# Patient Record
Sex: Male | Born: 1960 | Hispanic: Yes | Marital: Married | Smoking: Never smoker
Health system: Southern US, Community
[De-identification: ages and names within clinical notes are randomized; demographics above are authoritative.]

---

## 2019-08-12 ENCOUNTER — Ambulatory Visit: Payer: Self-pay | Admitting: Family Medicine

## 2019-08-13 ENCOUNTER — Ambulatory Visit (INDEPENDENT_AMBULATORY_CARE_PROVIDER_SITE_OTHER): Payer: 59

## 2019-08-13 ENCOUNTER — Encounter: Payer: Self-pay | Admitting: Family Medicine

## 2019-08-13 ENCOUNTER — Other Ambulatory Visit: Payer: Self-pay

## 2019-08-13 ENCOUNTER — Ambulatory Visit (INDEPENDENT_AMBULATORY_CARE_PROVIDER_SITE_OTHER): Payer: 59 | Admitting: Family Medicine

## 2019-08-13 VITALS — BP 138/90 | HR 61 | Ht 63.0 in | Wt 183.0 lb

## 2019-08-13 DIAGNOSIS — R350 Frequency of micturition: Secondary | ICD-10-CM

## 2019-08-13 DIAGNOSIS — M542 Cervicalgia: Secondary | ICD-10-CM

## 2019-08-13 DIAGNOSIS — R030 Elevated blood-pressure reading, without diagnosis of hypertension: Secondary | ICD-10-CM

## 2019-08-13 DIAGNOSIS — R519 Headache, unspecified: Secondary | ICD-10-CM | POA: Diagnosis not present

## 2019-08-13 DIAGNOSIS — M25512 Pain in left shoulder: Secondary | ICD-10-CM

## 2019-08-13 NOTE — Assessment & Plan Note (Signed)
Xray of shoulder ordered however suspect that shoulder pain is referred pain from neck.

## 2019-08-13 NOTE — Progress Notes (Signed)
Lee Morris - 59 y.o. male MRN [id number]  Date of birth: 24-Dec-1960  Subjective Chief Complaint  Patient presents with  . New Patient (Initial Visit)   Interpreter unavailable today and he declines telephone interpreter.  He requests that his daughter translate for visit today.   HPI Lee Morris is a 59 y.o. male with history of frequent headaches here today for initial visit.  He also has concern of frequent urination and L neck/shoulder pain.   -Frequent headaches:  Reports having several headaches a week for several years.  Headaches occur in the back on the head with radiation into the front and temple area.  He denies aura or prodromal symptoms.  He does not have associated nausea or light sensitivity.   He has had some chronic neck and shoulder pain as well.  He denies numbness/tingling into the arm.  This pain is worse with lifting or overhead movement.  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   His previous PCP has prescribed medications for this but he is unsure what they were.  He has thrown them out because they were not helpful.    -Frequent urination:  Reports frequent urination and nocturia.  Reports that he will get up to go the restroom around 5 times per night.  He denies trouble starting his stream, pain with urination or hematuria.   ROS:  A comprehensive ROS was completed and negative except as noted per HPI  No Known Allergies  History reviewed. No pertinent past medical history.  History reviewed. No pertinent surgical history.  Social History   Socioeconomic History  . Marital status: Not on file    Spouse name: Not on file  . Number of children: Not on file  . Years of education: Not on file  . Highest education level: Not on file  Occupational History  . Not on file  Tobacco Use  . Smoking status: Never Smoker  . Smokeless tobacco: Never Used  Substance and Sexual Activity  . Alcohol use: Not  Currently  . Drug use: Never  . Sexual activity: Not Currently  Other Topics Concern  . Not on file  Social History Narrative  . Not on file   Social Determinants of Health   Financial Resource Strain:   . Difficulty of Paying Living Expenses: Not on file  Food Insecurity:   . Worried About Programme researcher, broadcasting/film/video in the Last Year: Not on file  . Ran Out of Food in the Last Year: Not on file  Transportation Needs:   . Lack of Transportation (Medical): Not on file  . Lack of Transportation (Non-Medical): Not on file  Physical Activity:   . Days of Exercise per Week: Not on file  . Minutes of Exercise per Session: Not on file  Stress:   . Feeling of Stress : Not on file  Social Connections:   . Frequency of Communication with Friends and Family: Not on file  . Frequency of Social Gatherings with Friends and Family: Not on file  . Attends Religious Services: Not on file  . Active Member of Clubs or Organizations: Not on file  . Attends Banker Meetings: Not on file  . Marital Status: Not on file    No family history on file.  Health Maintenance  Topic Date Due  . Hepatitis C Screening  1960-10-11  . HIV Screening  07/11/1975  . TETANUS/TDAP  07/11/1979  .  COLONOSCOPY  07/10/2010  . INFLUENZA VACCINE  09/08/2019 (Originally 01/09/2019)     ----------------------------------------------------------------------------------------------------------------------------------------------------------------------------------------------------------------- Physical Exam BP 138/90   Pulse 61   Ht 5\' 3"  (1.6 m)   Wt 183 lb (83 kg)   BMI 32.42 kg/m   Physical Exam Constitutional:      Appearance: Normal appearance.  HENT:     Head: Normocephalic and atraumatic.     Mouth/Throat:     Mouth: Mucous membranes are moist.  Eyes:     General: No scleral icterus. Cardiovascular:     Rate and Rhythm: Normal rate and regular rhythm.  Pulmonary:     Effort: Pulmonary  effort is normal.     Breath sounds: Normal breath sounds.  Musculoskeletal:     Cervical back: Normal range of motion.     Comments: L shoulder with good ROM.  Non-tender along bicipital groove.  Impingement testing with mildly positive hawkins test.  Strength of shoulder/rotator cuff muscles are normal.   Skin:    General: Skin is warm and dry.  Neurological:     General: No focal deficit present.     Mental Status: He is alert.  Psychiatric:        Mood and Affect: Mood normal.        Behavior: Behavior normal.     ------------------------------------------------------------------------------------------------------------------------------------------------------------------------------------------------------------------- Assessment and Plan  Left shoulder pain Xray of shoulder ordered however suspect that shoulder pain is referred pain from neck.    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  Will await records from previous PCP to see what he has tried previously for treatment of this.  There may be an element of analgesic rebound as well as he is using tylenol daily to try and manage this.  He has never had imaging of his neck, xrays of c spine ordered.    Urinary frequency Urinary frequency throughout the day with nocturia.  Check UA, A1c and PSA today.    No orders of the defined types were placed in this encounter.   No follow-ups on file.    This visit occurred during the SARS-CoV-2 public health emergency.  Safety protocols were in place, including screening questions prior to the visit, additional usage of staff PPE, and extensive cleaning of exam room while observing appropriate contact time as indicated for disinfecting solutions.

## 2019-08-13 NOTE — Assessment & Plan Note (Addendum)
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  Will await records from previous PCP to see what he has tried previously for treatment of this.  There may be an element of analgesic rebound as well as he is using tylenol daily to try and manage this.  He has never had imaging of his neck, xrays of c spine ordered.

## 2019-08-13 NOTE — Assessment & Plan Note (Signed)
Urinary frequency throughout the day with nocturia.  Check UA, A1c and PSA today.

## 2019-08-13 NOTE — Patient Instructions (Signed)
Great to meet you today! Please have labs and xray completed. We'll call with results.   Please complete release for records from your previous primary care office.    Cefalea tensional en los adultos Tension Headache, Adult Una cefalea tensional es una sensacin de dolor o presin que suele manifestarse en la frente y a los lados de la cabeza. El dolor puede ser sordo o puede sentirse que comprime (constrictivo). Existen dos tipos de cefaleas tensionales:  Cefalea tensional episdica. Los dolores de [place] se manifiestan cada menos de 15das al [employer].  Cefalea tensional crnica. Los dolores de cabeza se manifiestan cada ms de 15das al mes, durante un perodo de 18meses. Las cefaleas tensionales pueden durar de 30 minutos a varios das. Este es el tipo ms comn de dolor de [place]. Generalmente, no se asocia con nuseas o vmitos y no empeora con la actividad fsica. Cules son las causas? Se desconoce la causa exacta de esta afeccin. Las cefaleas tensionales generalmente aparecen despus de una situacin de estrs, ansiedad o por depresin. Otros factores desencadenantes pueden incluir:  Consumir alcohol.  Demasiada cafena o abstinencia de cafena.  Infecciones respiratorias, como resfriados, gripes o sinusitis.  Problemas dentales o apretar los dientes.  Cansancio (fatiga).  Mantener la cabeza y el cuello en la misma posicin durante un perodo prolongado, por ejemplo, al usar la computadora.  Fumar.  Artritis del cuello. Cules son los signos o los sntomas? Los sntomas de esta afeccin [employer] siguientes:  Sensacin de presin o rigidez alrededor de la cabeza.  Dolor "sordo" en la cabeza.  Dolor que siente sobre la frente y los lados de la cabeza.  Sensibilidad en los msculos de la cabeza, del cuello y de los hombros. Cmo se diagnostica? Esta afeccin se puede diagnosticar en funcin de los sntomas, la historia clnica y los antecedentes mdicos. Si los  sntomas son agudos o inusuales, es posible que le hagan estudios de diagnstico por imgenes, como una tomografa computarizada (TC) o una resonancia magntica (RM) de la cabeza. Tambin le pueden revisar la vista. Cmo se trata? Esta afeccin puede tratarse con cambios en el estilo de vida y medicamentos que ayudan a Public house manager los sntomas. Siga estas indicaciones en su casa: Control del [employer] de venta libre y los recetados solamente como se lo haya indicado el mdico.  Cuando sienta dolor de cabeza acustese en un cuarto oscuro y tranquilo.  Si se lo indican, aplique hielo sobre la cabeza y el cuello: ? Ponga el hielo en una bolsa plstica. ? Coloque una [employer] piel y la bolsa de hielo. ? Coloque el hielo durante 98minutos, de 2 a 3veces por da.  Si se lo indican, aplique calor en la zona posterior del cuello con la frecuencia que le haya indicado el mdico. Use la fuente de calor que el mdico le recomiende, como una compresa de calor hmedo o una almohadilla trmica. ? Coloque una [employer] piel y la fuente de Freight forwarder. ? Aplique el calor durante 20 a 46minutos. ? Retire la fuente de calor si la piel se pone de color rojo brillante. Esto es muy importante si no puede Education officer, environmental, calor o fro. Puede correr un riesgo mayor de sufrir quemaduras. Comida y bebida  Mantenga un horario para las comidas.  Limite el consumo de alcohol a no ms de 32medida por da si es mujer y no est [place], y 54medidas por da si es hombre. Una medida equivale a 12oz (  ) de cerveza, 5oz ( ) de vino o 1oz (71ml) de bebidas alcohlicas de alta graduacin.  Beba suficiente lquido para Photographer orina de color amarillo plido.  Disminuya el consumo de cafena o deje de consumir cafena. Estilo de vida  Duerma entre 7 y 9horas o la cantidad de horas que le haya recomendado el mdico.  A la hora de dormir, retire todos los dispositivos  electrnicos de su habitacin. Los dispositivos electrnicos incluyen las computadoras, telfonos y tabletas.  Busque maneras de Charity fundraiser. Entre las cosas que pueden ayudar a Paramedic el estrs se incluyen: ? Actividad fsica. ? Ejercicios de respiracin profunda. ? Practicar yoga. ? Optometrist. ? Visualizaciones positivas.  Trate de sentarse derecho y [employer].  No consuma ningn producto que contenga nicotina o tabaco, como cigarrillos y Administrator, Civil Service. Si necesita ayuda para dejar de fumar, consulte al mdico. Instrucciones generales   Concurra a todas las visitas de control como se lo haya indicado el mdico. Esto es importante.  Evite cualquier desencadenante del dolor de cabeza. Lleve un diario de los dolores de cabeza para Financial risk analyst qu factores pueden desencadenarlos. Por ejemplo, escriba: ? Lo que usted come y bebe. ? Cunto tiempo duerme. ? Algn cambio en su dieta o en los medicamentos. Comunquese con un mdico si:  Si la cefalea no mejora.  La cefalea regresa.  Tiene sensibilidad a los sonidos, la luz o los olores debido a Surveyor, quantity.  Tiene nuseas o vmitos.  Le duele el estmago. Solicite ayuda de inmediato si:  Aparece una cefalea muy aguda y repentina junto con alguno de los siguientes sntomas: ? Rigidez en el cuello. ? Nuseas y vmitos. ? Confusin. ? Debilidad. ? Visin doble o prdida de la visin. ? Falta de aire. ? Erupcin cutnea. ? Somnolencia inusual. ? Grant Ruts. ? Dificultad para hablar. ? Dolor en los ojos o los odos. ? Dificultad para caminar o mantener el equilibrio. ? Mareo o desmayo. Resumen  Una cefalea tensional es una sensacin de dolor o presin que suele manifestarse en la frente y a los lados de la cabeza.  Las cefaleas tensionales pueden durar de 30 minutos a [address]. Este es el tipo ms comn de dolor de [place].  Esta afeccin se puede diagnosticar en funcin de los  sntomas, la historia clnica y los antecedentes mdicos.  Esta afeccin puede tratarse con cambios en el estilo de vida y medicamentos que ayudan a Paramedic los sntomas. Esta informacin no tiene Theme park manager el consejo del mdico. Asegrese de hacerle al mdico cualquier pregunta que tenga. Document Revised: 12/30/2016 Document Reviewed: 12/30/2016 Elsevier Patient Education  2020 [employer].

## 2019-08-14 LAB — URINALYSIS, ROUTINE W REFLEX MICROSCOPIC
Bacteria, UA: NONE SEEN /HPF
Bilirubin Urine: NEGATIVE
Glucose, UA: NEGATIVE
Hgb urine dipstick: NEGATIVE
Hyaline Cast: NONE SEEN /LPF
Ketones, ur: NEGATIVE
Leukocytes,Ua: NEGATIVE
Nitrite: NEGATIVE
Specific Gravity, Urine: 1.024 (ref 1.001–1.03)
Squamous Epithelial / HPF: NONE SEEN /HPF (ref <=5)
WBC, UA: NONE SEEN /HPF (ref 0–5)
pH: 6.5 (ref 5.0–8.0)

## 2019-08-14 LAB — COMPLETE METABOLIC PANEL WITH GFR
AG Ratio: 1.6 (calc) (ref 1.0–2.5)
ALT: 20 U/L (ref 9–46)
AST: 19 U/L (ref 10–35)
Albumin: 4.5 g/dL (ref 3.6–5.1)
Alkaline phosphatase (APISO): 79 U/L (ref 35–144)
BUN: 17 mg/dL (ref 7–25)
CO2: 26 mmol/L (ref 20–32)
Calcium: 9.7 mg/dL (ref 8.6–10.3)
Chloride: 104 mmol/L (ref 98–110)
Creat: 0.88 mg/dL (ref 0.70–1.33)
GFR, Est African American: 109 mL/min/{1.73_m2} (ref >=60)
GFR, Est Non African American: 94 mL/min/{1.73_m2} (ref >=60)
Globulin: 2.9 g/dL (calc) (ref 1.9–3.7)
Glucose, Bld: 82 mg/dL (ref 65–99)
Potassium: 4.2 mmol/L (ref 3.5–5.3)
Sodium: 141 mmol/L (ref 135–146)
Total Bilirubin: 1.5 mg/dL — ABNORMAL HIGH (ref 0.2–1.2)
Total Protein: 7.4 g/dL (ref 6.1–8.1)

## 2019-08-14 LAB — CBC
HCT: 50.7 % — ABNORMAL HIGH (ref 38.5–50.0)
Hemoglobin: 17.6 g/dL — ABNORMAL HIGH (ref 13.2–17.1)
MCH: 31.4 pg (ref 27.0–33.0)
MCHC: 34.7 g/dL (ref 32.0–36.0)
MCV: 90.5 fL (ref 80.0–100.0)
MPV: 11.2 fL (ref 7.5–12.5)
Platelets: 202 10*3/uL (ref 140–400)
RBC: 5.6 10*6/uL (ref 4.20–5.80)
RDW: 12.5 % (ref 11.0–15.0)
WBC: 7.8 10*3/uL (ref 3.8–10.8)

## 2019-08-14 LAB — HEMOGLOBIN A1C
Hgb A1c MFr Bld: 5.3 % of total Hgb (ref <5.7)
Mean Plasma Glucose: 105 (calc)
eAG (mmol/L): 5.8 (calc)

## 2019-08-14 LAB — PSA: PSA: 0.7 ng/mL (ref <=4.0)

## 2019-08-14 LAB — TSH: TSH: 2.01 mIU/L (ref 0.40–4.50)

## 2019-08-19 ENCOUNTER — Other Ambulatory Visit: Payer: Self-pay | Admitting: Family Medicine

## 2019-08-19 MED ORDER — MELOXICAM 7.5 MG PO TABS: 7.5000 mg | ORAL_TABLET | Freq: Every day | ORAL | 1 refills | Status: AC

## 2019-08-19 NOTE — Progress Notes (Unsigned)
meloxic

## 2019-09-27 ENCOUNTER — Ambulatory Visit: Payer: 59 | Admitting: Family Medicine

## 2021-08-22 IMAGING — DX DG SHOULDER 2+V*L*
3 series · 3 of 3 positions shown · non-contrast
Comparison: None.

CLINICAL DATA: Left shoulder pain

EXAM:
LEFT SHOULDER - 2+ VIEW

[shoulder grashey]
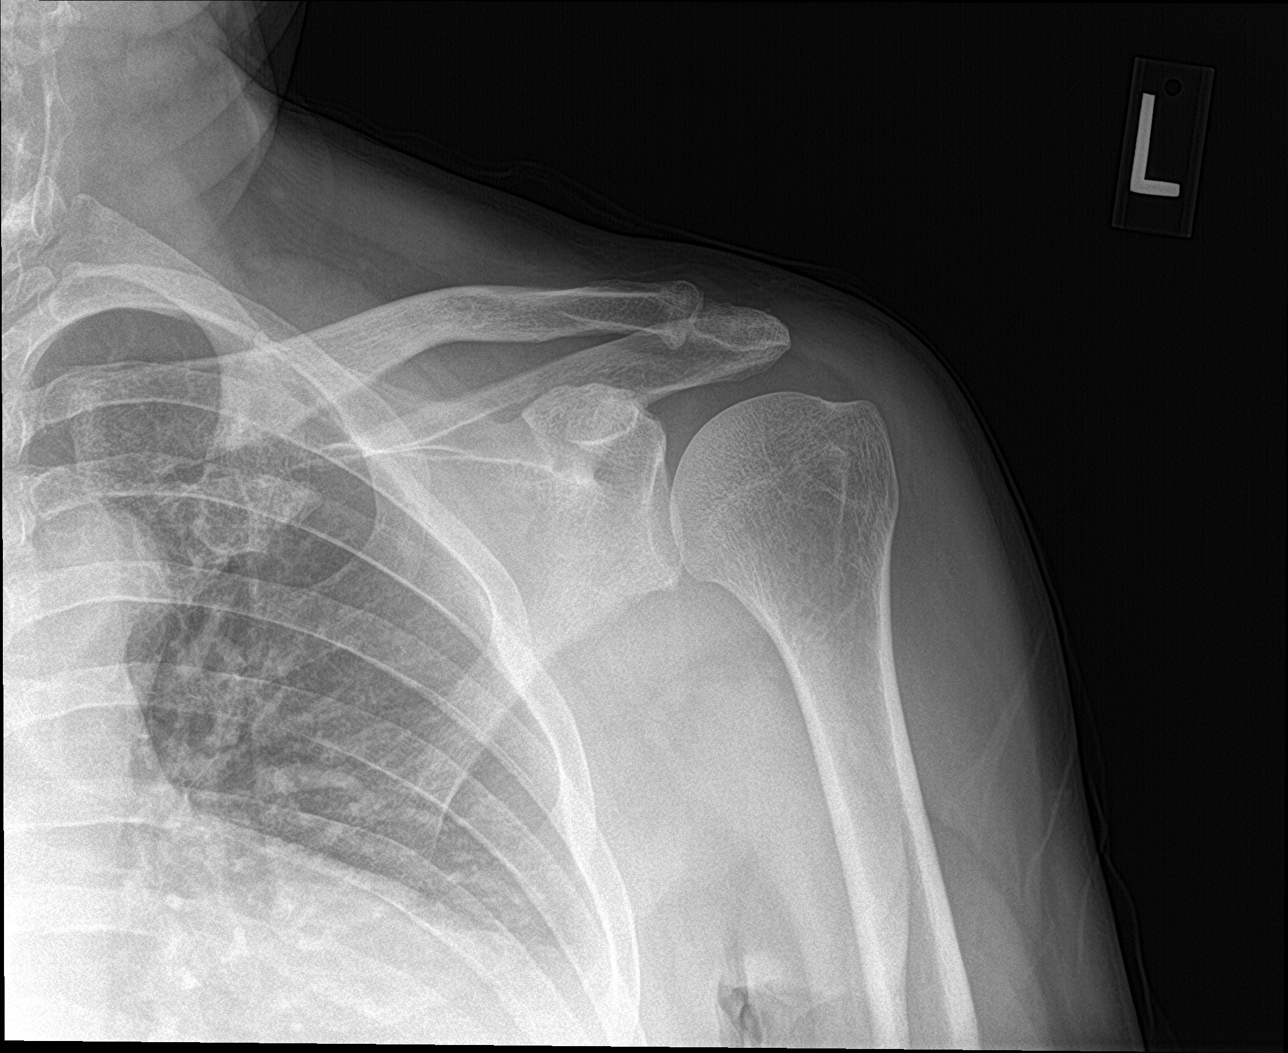

[shoulder y view]
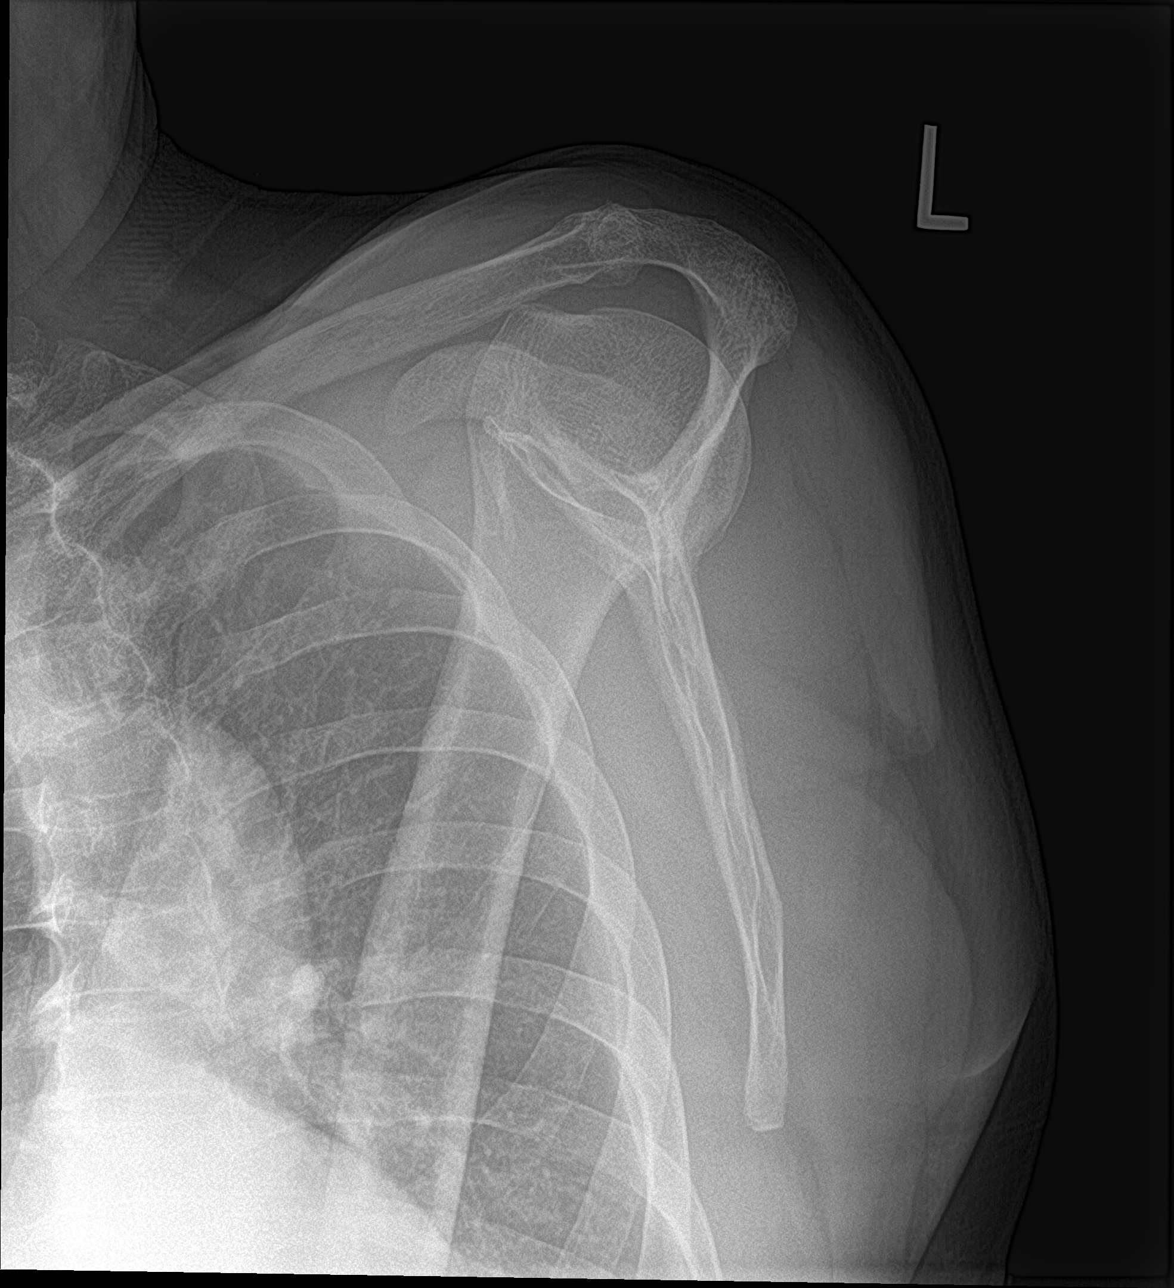

[shoulder axillary]
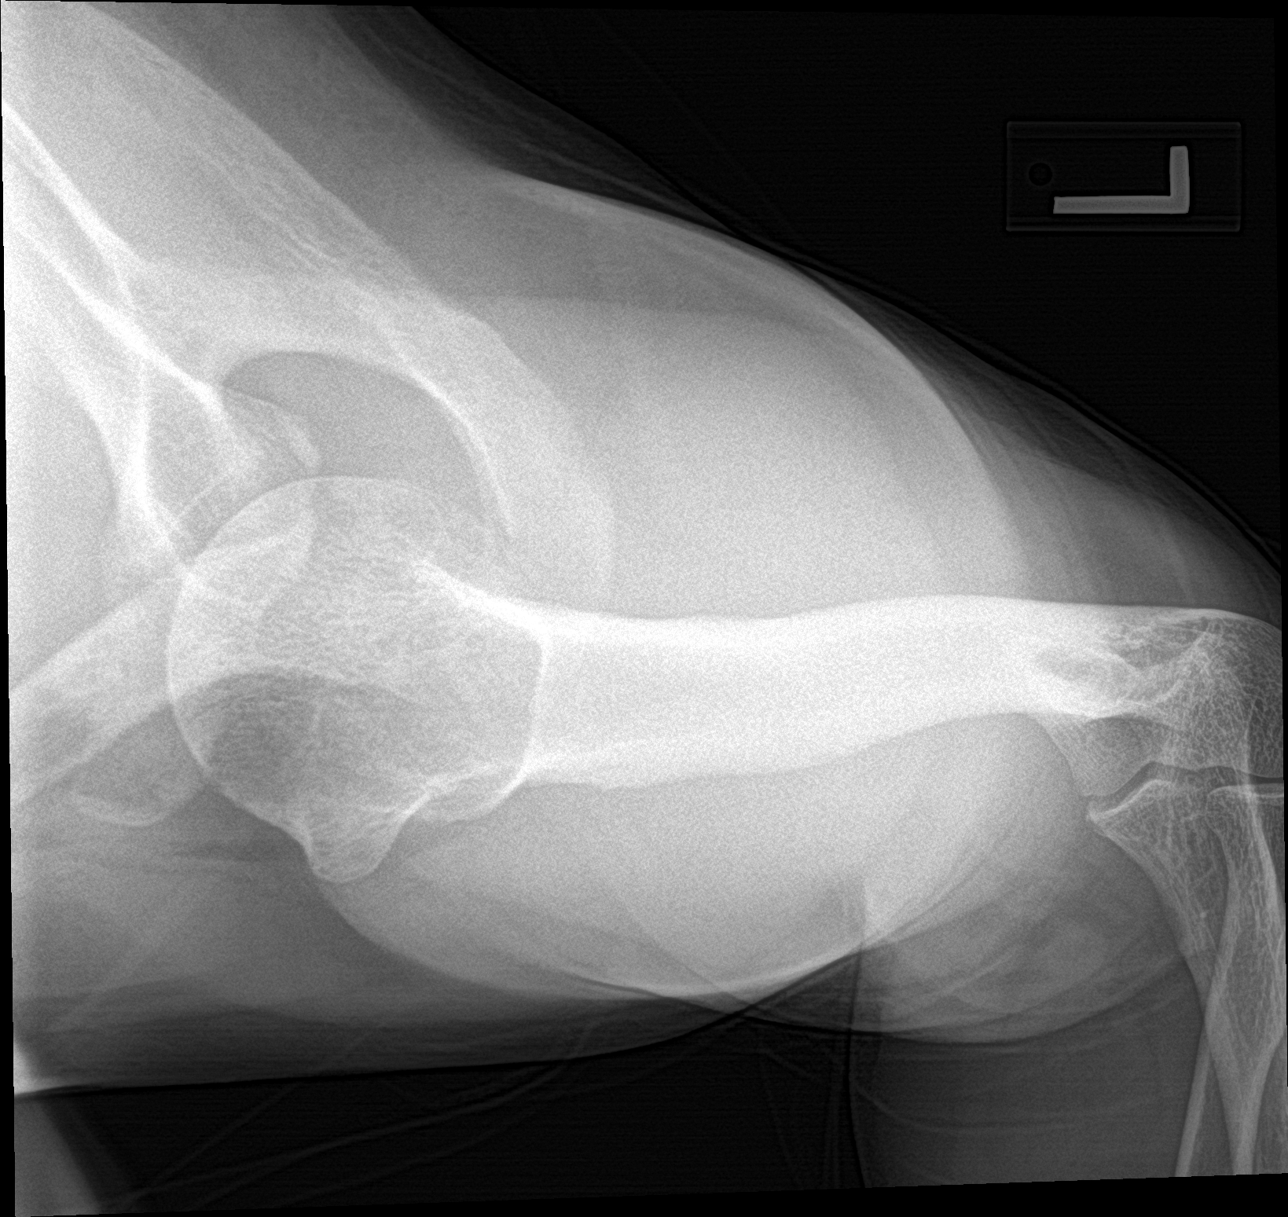

[3 of 3 positions shown; findings below may reference images not displayed]

FINDINGS: There is no evidence of fracture or dislocation. There is no
evidence of arthropathy or other focal bone abnormality. Soft
tissues are unremarkable.
IMPRESSION: Negative.
